# Patient Record
Sex: Female | Born: 2005 | Race: White | Hispanic: No | Marital: Single | Smoking: Never smoker
Health system: Southern US, Community
[De-identification: ages and names within clinical notes are randomized; demographics above are authoritative.]

---
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
## 2023-01-24 ENCOUNTER — Ambulatory Visit: Payer: Medicaid Other | Admitting: Child and Adolescent Psychiatry

## 2023-02-03 ENCOUNTER — Encounter: Payer: Self-pay | Admitting: Child and Adolescent Psychiatry

## 2023-02-03 ENCOUNTER — Ambulatory Visit (INDEPENDENT_AMBULATORY_CARE_PROVIDER_SITE_OTHER): Payer: Medicaid Other | Admitting: Child and Adolescent Psychiatry

## 2023-02-03 DIAGNOSIS — F411 Generalized anxiety disorder: Secondary | ICD-10-CM

## 2023-02-03 DIAGNOSIS — F4010 Social phobia, unspecified: Secondary | ICD-10-CM | POA: Diagnosis not present

## 2023-02-03 DIAGNOSIS — F422 Mixed obsessional thoughts and acts: Secondary | ICD-10-CM

## 2023-02-03 DIAGNOSIS — F39 Unspecified mood [affective] disorder: Secondary | ICD-10-CM | POA: Diagnosis not present

## 2023-02-03 DIAGNOSIS — F509 Eating disorder, unspecified: Secondary | ICD-10-CM

## 2023-02-03 MED ORDER — ARIPIPRAZOLE 10 MG PO TABS
10.0000 mg | ORAL_TABLET | Freq: Every day | ORAL | 1 refills | Status: AC
Start: 2023-02-03 — End: ?

## 2023-02-03 MED ORDER — OXCARBAZEPINE 150 MG PO TABS: 150.0000 mg | ORAL_TABLET | Freq: Two times a day (BID) | ORAL | 1 refills | Status: AC

## 2023-02-03 MED ORDER — ESCITALOPRAM OXALATE 10 MG PO TABS
15.0000 mg | ORAL_TABLET | Freq: Every day | ORAL | 1 refills | Status: AC
Start: 2023-02-03 — End: 2023-08-02

## 2023-02-03 NOTE — Progress Notes (Signed)
BH MD/PA/NP OP Progress Note  02/03/2023 3:30 PM Joann Mason  MRN:  [id number]  Chief Complaint: Medication management follow-up for mood, anxiety, OCD, PTSD and eating disorder.  HPI: Joann Mason was seen and evaluated in the office for follow-up appointment.  She was present by herself and was evaluated alone.   She reported that she tried taking Lamictal for about 1-1/2 weeks and noticed herself being numb to her feelings and therefore discontinued taking it.  She reported that overall she has been doing well, has not been having long periods of depressed mood however her mood continues to fluctuate frequently.  She reported that her mood might be happy and then can quickly become irritable or sad.  This does not last long.  She reported that this is manageable but it bothers her.  She reported that otherwise she is doing well in regards of her sleep, eating well, denied any restrictive behaviors regarding eating, denied binging or purging with eating.  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  She denies any SI or HI, denied any nonsuicidal self-harm behaviors or thoughts.  We discussed risks and benefits associated with continuing current medications versus increasing the dose of Abilify.  Discussed the concerns regarding metabolic side effects associated with Abilify, and therefore discussed to try Trileptal instead for mood lability.  Discussed risks and benefits, side effects associated with Trileptal and she verbalized understanding and provided verbal informed consent.  She has not done her blood work or EKG as discussed at the last appointment, and strongly encouraged her to do it.  She verbalized understanding especially since she is taking nortriptyline for her migraine while also taking Lexapro.  Visit Diagnosis:    ICD-10-CM   1. Mood disorder  (HCC)  F39 ARIPiprazole (ABILIFY) 10 MG tablet    2. Social anxiety disorder  F40.10 escitalopram (LEXAPRO) 10 MG tablet    3. GAD (generalized anxiety disorder)  F41.1 escitalopram (LEXAPRO) 10 MG tablet    4. Mixed obsessional thoughts and acts  F42.2 escitalopram (LEXAPRO) 10 MG tablet    5. Eating disorder, unspecified type  F50.9 escitalopram (LEXAPRO) 10 MG tablet               Past Psychiatric History:1 previous psychaitric hospitalization in 2022 for a week No past medication trials and on current medication since about 2022  Past Medical History: History reviewed. No pertinent past medical history. History reviewed. No pertinent surgical history.  Family Psychiatric History: As mentioned in initial H&P, reviewed today, no change   Family History: History reviewed. No pertinent family history.  Social History:  Social History   Socioeconomic History   Marital status: Single    Spouse name: Not on file   Number of children: Not on file   Years of education: Not on file   Highest education level: Not on file  Occupational History   Not on file  Tobacco Use   Smoking status: Never   Smokeless tobacco: Never  Vaping Use   Vaping status: Every Day   Substances: Nicotine  Substance and Sexual Activity   Alcohol use: No   Drug use: No   Sexual activity: Never  Other Topics Concern   Not on file  Social History Narrative   Not on file   Social Determinants of Health   Financial Resource Strain: Not on file  Food Insecurity: Not on file  Transportation Needs: Not on file  Physical Activity: Not on file  Stress: Not on file  Social Connections: Not on file    Allergies: No Known Allergies  Metabolic Disorder Labs: Lab Results  Component Value Date   HGBA1C 5.5 06/07/2022   MPG 99.67 12/30/2020   MPG 103 09/20/2020   No results found for: "PROLACTIN" Lab Results  Component Value Date   CHOL 143 06/07/2022   TRIG 93 (H) 06/07/2022   HDL 39  (L) 06/07/2022   CHOLHDL 3.7 06/07/2022   VLDL 6 12/30/2020   LDLCALC 86 06/07/2022   LDLCALC 68 12/30/2020   Lab Results  Component Value Date   TSH 2.923 09/20/2020   TSH 2.784 10/29/2016    Therapeutic Level Labs: No results found for: "LITHIUM" No results found for: "VALPROATE" No results found for: "CBMZ"  Current Medications: Current Outpatient Medications  Medication Sig Dispense Refill   ARIPiprazole (ABILIFY) 10 MG tablet Take 1 tablet (10 mg total) by mouth daily. 90 tablet 1   escitalopram (LEXAPRO) 10 MG tablet Take 1.5 tablets (15 mg total) by mouth daily. 135 tablet 1   nortriptyline (PAMELOR) 10 MG capsule Take nortriptyline 10 mg nightly for 1 week, then increase to 20 mg nightly and continue this dose     OXcarbazepine (TRILEPTAL) 150 MG tablet Take 1 tablet (150 mg total) by mouth 2 (two) times daily. 60 tablet 1   traZODone (DESYREL) 100 MG tablet Take 1 tablet (100 mg total) by mouth at bedtime. 90 tablet 1   Vitamin D, Ergocalciferol, (DRISDOL) 1.25 MG (50000 UNIT) CAPS capsule Take 50,000 Units by mouth once a week.     No current facility-administered medications for this visit.     Musculoskeletal: Strength & Muscle Tone: unable to assess since visit was over the telemedicine.  Gait & Station: unable to assess since visit was over the telemedicine.  Patient leans: N/A  Vitals:   02/03/23 1208  BP: 122/84  Pulse: 91  Temp: 97.9 F (36.6 C)  SpO2: 97%       Psychiatric Specialty Exam: Review of Systems  Blood pressure 122/84, pulse 91, temperature 97.9 F (36.6 C), temperature source Temporal, height 5\' 1"  (1.549 m), weight 299 lb 3.2 oz (135.7 kg), SpO2 97%.Body mass index is 56.53 kg/m.  General Appearance: Casual and Wearing nose ring, earrings  Eye Contact:   fair  Speech:  Clear and Coherent and Normal Rate  Volume:  Normal  Mood:   "ok"  Affect:  Appropriate, Congruent, and Full Range  Thought Process:  Goal Directed and Linear   Orientation:  Full (Time, Place, and Person)  Thought Content: Logical   Suicidal Thoughts:  No  Homicidal Thoughts:  No  Memory:  Immediate;   Fair Recent;   Fair Remote;   Fair  Judgement:  Fair  Insight:  Fair  Psychomotor Activity:  Normal  Concentration:  Concentration: Fair and Attention Span: Fair  Recall:  [employer] of Knowledge: Fair  Language: Fair  Akathisia:  No    AIMS (if indicated): not done  Assets:  Communication Skills Desire for Improvement Financial Resources/Insurance Leisure Time Physical Health Social Support Transportation Vocational/Educational  ADL's:  Intact  Cognition: WNL  Sleep:  Fair   Screenings: AIMS    Flowsheet Row [facility]  AIMS Total Score 0      GAD-7    Flowsheet Row Office  Visit from 06/09/2022 in [facility] Office Visit from 03/16/2022 in [facility] Office Visit from 05/11/2021 in [facility] Office Visit from 12/30/2020 in [facility]  Total GAD-7 Score 3 7 20 17       PHQ2-9    Flowsheet Row Office Visit from 06/09/2022 in [facility] Office Visit from 03/16/2022 in [facility] Office Visit from 09/03/2021 in [facility] Office Visit from 08/03/2021 in [facility] Office Visit from 05/11/2021 in [facility]  PHQ-2 Total Score 1 2 2 4 4   PHQ-9 Total Score 2 7 10 14 10       Flowsheet Row ED from 12/06/2022 in [facility] Visit from 12/30/2020 in [facility] Admission (Discharged) from [facility]2020 [facility]  C-SSRS RISK CATEGORY No Risk Error: Q3, 4, or 5 should not be populated when Q2 is No Error: Q3, 4, or 5 should not be populated when Q2 is No        Assessment and Plan:   17 year old female with prior psychiatric history of one previous psychiatric hospitalization, referred by inpatient psychiatric team to establish outpatient psychiatric care post discharge in 09/2020.  She is genetically predisposed and her hx appears most consistent with GAD, Social Anxiety Disorder, Recurrent MDD, Eating Disorder, OCD and PTSD in the context of chronic psychosocial stressors. She also has hx of chronic and intermittent suicidal thoughts and non suicidal self harm behaviors.   She previously reported mood lability describing as periods of depression(few hours to few weeks) and "mania"(few hours to half a week). Her reports of symptoms in the past appeared consistent with hypomania but her mother does not appear to have observed any such symptoms/epsiodes.  Therefore dx most likely consistent with MDD vs Bipolar disorder however give her reports of mood lability recommend to continue with Abilify.  Update on 02/03/23 -reviewed response to her current medications, she reported that she was having more problems with Lamictal therefore she discontinued it.  Continues to report having mood instability, her anxiety is stable, she does not appear depressed.  We will try Trileptal and reassess in 6 weeks or earlier if needed.    Plan as below   # Mood(chronic and unstable) -Start Trileptal 150 mg twice daily -Continue Lexapro 15 milligram. -Continue with Abilify 10 mg daily -[facility], completed group therapy Kelli Trippi.    # Anxiety (chronic and unstable), OCD (chronic and stable) - Same as mentioned above for depression     # PTSD (Chronic, stable) - Same as mentioned above.   # Eating Disorder (Chronic and stable) -  Continue to monitor  # Sleep -Continue trazodone 50 mg QHS for sleep.   # Labs Labs from 05/2022:  Her CBC/CMP is stable, HbA1c - 5.5 and Lipid panel is WNL except HDL of 39 and TG of 93. Her vit D level is very low at 11.2 and therefore recommended to speak with PCP as she probably needs high dose vitamin D treatment. She will call her PCP, and in meantime atleast will start taking OTC vitamin D 1000 Units.   Repeat labs ordered as well as EKG to monitor QTC.        Amareon Phung Mariea Clonts,  MD 02/03/2023, 3:30 PM

## 2023-03-10 ENCOUNTER — Ambulatory Visit: Payer: Medicaid Other | Admitting: Child and Adolescent Psychiatry
